# Patient Record
Sex: Male | Born: 2005 | Race: White | Hispanic: No | Marital: Single | Smoking: Never smoker
Health system: Southern US, Community
[De-identification: ages and names within clinical notes are randomized; demographics above are authoritative.]

## PROBLEM LIST (undated history)

## (undated) DIAGNOSIS — K219 Gastro-esophageal reflux disease without esophagitis: Secondary | ICD-10-CM

## (undated) DIAGNOSIS — R010 Benign and innocent cardiac murmurs: Secondary | ICD-10-CM

## (undated) DIAGNOSIS — H6690 Otitis media, unspecified, unspecified ear: Secondary | ICD-10-CM

## (undated) HISTORY — DX: Benign and innocent cardiac murmurs: R01.0

## (undated) HISTORY — PX: TYMPANOSTOMY TUBE PLACEMENT: SHX32

## (undated) HISTORY — DX: Otitis media, unspecified, unspecified ear: H66.90

## (undated) HISTORY — DX: Gastro-esophageal reflux disease without esophagitis: K21.9

---
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
## 2024-07-03 ENCOUNTER — Telehealth: Payer: Self-pay | Admitting: Family Medicine

## 2024-07-03 NOTE — Telephone Encounter (Signed)
 Copied from CRM [id number]. Topic: Medical Record Request - Records Request >> Jul 03, 2024  1:30 PM [place] E wrote: Reason for CRM: Patient's mother, Jon, called in requesting patient's immunization record to be emailed to [email or web address], or she could come into the office to pick this up. Patient has not been seen by PCP for 2 years.

## 2024-07-03 NOTE — Telephone Encounter (Signed)
 Immunization record emailed to [place] as requested.
